# Patient Record
Sex: Female | Born: 1985 | Race: White | Hispanic: No | Marital: Married | Smoking: Never smoker
Health system: Southern US, Community
[De-identification: ages and names within clinical notes are randomized; demographics above are authoritative.]

## PROBLEM LIST (undated history)

## (undated) DIAGNOSIS — F319 Bipolar disorder, unspecified: Secondary | ICD-10-CM

---

## 2013-07-24 DIAGNOSIS — G8929 Other chronic pain: Secondary | ICD-10-CM | POA: Insufficient documentation

## 2013-07-24 DIAGNOSIS — M545 Low back pain, unspecified: Secondary | ICD-10-CM | POA: Insufficient documentation

## 2013-09-03 DIAGNOSIS — F1120 Opioid dependence, uncomplicated: Secondary | ICD-10-CM | POA: Insufficient documentation

## 2014-08-03 ENCOUNTER — Other Ambulatory Visit: Payer: Self-pay | Admitting: Family Medicine

## 2014-08-03 ENCOUNTER — Ambulatory Visit
Admission: RE | Admit: 2014-08-03 | Discharge: 2014-08-03 | Disposition: A | Discharge status: Home / Self Care | Payer: Worker's Compensation | Source: Ambulatory Visit | Attending: Family Medicine | Admitting: Family Medicine

## 2014-08-03 DIAGNOSIS — T1490XA Injury, unspecified, initial encounter: Secondary | ICD-10-CM

## 2015-06-23 ENCOUNTER — Other Ambulatory Visit: Payer: Self-pay | Admitting: Occupational Medicine

## 2015-06-23 ENCOUNTER — Ambulatory Visit: Payer: Self-pay

## 2015-06-23 DIAGNOSIS — M79641 Pain in right hand: Secondary | ICD-10-CM

## 2017-03-06 ENCOUNTER — Ambulatory Visit (INDEPENDENT_AMBULATORY_CARE_PROVIDER_SITE_OTHER): Payer: Medicaid Other | Admitting: Family Medicine

## 2017-03-06 VITALS — BP 136/83 | HR 88 | Wt 155.0 lb

## 2017-03-06 DIAGNOSIS — F1120 Opioid dependence, uncomplicated: Secondary | ICD-10-CM | POA: Diagnosis not present

## 2017-03-06 DIAGNOSIS — M545 Low back pain, unspecified: Secondary | ICD-10-CM

## 2017-03-06 DIAGNOSIS — G8929 Other chronic pain: Secondary | ICD-10-CM

## 2017-03-06 MED ORDER — CLONIDINE HCL 0.1 MG PO TABS: 0.1000 mg | ORAL_TABLET | Freq: Two times a day (BID) | ORAL | 0 refills | Status: DC
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
## 2017-03-06 NOTE — Patient Instructions (Addendum)
Try calling [facility] [phone]  They can prescribe Suboxone for Opiate Addiction.   Take clonidine 0.1mg  twice daily for opiate withdrawal symptoms as needed.    Attend pt for back pain.   Recheck with me in 1 month.   Return sooner if needed.    Buprenorphine; Naloxone oral dissolving film What is this medicine? BUPRENORPHINE; NALOXONE (byoo pre NOR feen; nal OX one) is used to treat certain types of drug dependence. This medicine may be used for other purposes; ask your health care provider or pharmacist if you have questions. COMMON BRAND NAME(S): BUNAVAIL, Suboxone What should I tell my health care provider before I take this medicine? They need to know if you have any of these conditions: -brain tumor -drink more than 3 alcohol-containing drinks per day -head injury -heart disease -kidney disease -liver disease -lung or breathing disease, like asthma -an unusual or allergic reaction to buprenorphine, naloxone, other medicines, foods, dyes, or preservatives -pregnant or trying to get pregnant -breast-feeding How should I use this medicine? For sublingual use (Suboxone): Drink water to moisten the mouth. Then, place the medicine under the tongue and let it dissolve. Follow the directions on the prescription label. Leave this medicine in the sealed foil pack until you are ready to use it. If your dose requires you to take more than 1 film, place the second film under the tongue on other side of the mouth. If your dose requires you to take more than 2 films, place the third film under your tongue on either side after the first 2 films have dissolved. Do not let the films touch in your mouth. After you put this medicine in your mouth, do not move it. Do not swallow, cut, or chew the film. Take your medicine at regular intervals. Do not take your medicine more often than directed. For buccal use (Suboxone or Bunavail): Drink water to moisten the inside of  the cheek. Then, place the medicine against the inside of the moistened cheek and let it dissolve. Follow the directions on the prescription label. Leave this medicine in the sealed foil pack until you are ready to use it. If your dose requires you to take more than 1 film, place the second film on the inside of the other cheek. If your dose requires you to take more than 2 films, place the third film on the inside of your right or left cheek after the first 2 films have dissolved. After you put this medicine in your mouth, do not move it. Do not swallow, cut, or chew the film. Take your medicine at regular intervals. Do not take your medicine more often than directed. A special MedGuide will be given to you by the pharmacist with each prescription and refill. Be sure to read this information carefully each time. Talk to your pediatrician regarding the use of this medicine in children. Special care may be needed. Overdosage: If you think you have taken too much of this medicine contact a poison control center or emergency room at once. NOTE: This medicine is only for you. Do not share this medicine with others. What if I miss a dose? If you miss a dose, take it as soon as you can. If it is almost time for your next dose, take only that dose. Do not take double or extra doses. What may interact with this medicine? Do not take this medication with any of the following medicines: -cisapride -certain medicines for fungal infections like  ketoconazole and itraconazole -dofetilide -dronedarone -pimozide -ritonavir -thioridazine -ziprasidone This medicine may interact with the following medications: -alcohol -antihistamines for allergy, cough and cold -antiviral medicines for HIV or AIDS -atropine -certain antibiotics like clarithromycin, erythromycin, linezold, rifampin -certain medicines for anxiety or sleep -certain medicines for bladder problems like oxybutynin, tolterodine -certain medicines  for depression like amitriptyline, fluoxetine, sertraline -certain medicines for migraine headache like almotriptan, eletriptan, frovatriptan, naratriptan, rizatriptan, sumatriptan, zolmitriptan -certain medicines for nausea or vomiting like dolasetron, ondansetron, palonosetron -certain medicines for Parkinson's disease like benztropine, trihexyphenidyl -certain medicines for seizures like phenobarbital, primidone -certain medicines for stomach problems like cimetidine, dicyclomine, hyoscyamine -certain medicines for travel sickness like scopolamine -diuretics -general anesthetics like halothane, isoflurane, methoxyflurane, propofol -ipratropium -local anesthetics like lidocaine, pramoxine, tetracaine -MAOIs like Carbex, Eldepryl, Marplan, Nardil, and Parnate -medicines that relax muscles for surgery -methylene blue -other medicines that prolong the QT interval (cause an abnormal heart rhythm) -other narcotic medicines for pain or cough -phenothiazines like chlorpromazine, mesoridazine, prochlorperazine, thioridazine This list may not describe all possible interactions. Give your health care provider a list of all the medicines, herbs, non-prescription drugs, or dietary supplements you use. Also tell them if you smoke, drink alcohol, or use illegal drugs. Some items may interact with your medicine. What should I watch for while using this medicine? Tell your doctor or healthcare professional if your symptoms do not start to get better or if they get worse. Do not stop taking except on your doctor's advice. You may develop a severe reaction. Your doctor will tell you how much medicine to take. If you are also taking a narcotic medicine for pain or cough or another medicine that also causes drowsiness, you may have more side effects. Give your health care provider a list of all medicines you use. Your doctor will tell you how much medicine to take. Do not take more medicine than directed. Call  emergency for help if you have problems breathing or unusual sleepiness. You may get drowsy or dizzy. Do not drive, use machinery, or do anything that needs mental alertness until you know how this medicine affects you. Do not stand or sit up quickly, especially if you are an older patient. This reduces the risk of dizzy or fainting spells. Alcohol may interfere with the effect of this medicine. Avoid alcoholic drinks. Wear a medical ID bracelet or chain, and carry a card that describes your disease and details of your medicine and dosage regimens. This medicine may cause constipation. Try to have a bowel movement at least every 2 to 3 days. If you do not have a bowel movement for 3 days, call your doctor or health care professional. Your mouth may get dry. Chewing sugarless gum or sucking hard candy, and drinking plenty of water may help. Contact your doctor if the problem does not go away or is severe. What side effects may I notice from receiving this medicine? Side effects that you should report to your doctor or health care professional as soon as possible: -allergic reactions like skin rash, itching or hives, swelling of the face, lips, or tongue -breathing problems -confusion -signs and symptoms of a dangerous change in heartbeat or heart rhythm like chest pain; dizziness; fast or irregular heartbeat; palpitations; feeling faint or lightheaded, falls; breathing problems -signs and symptoms of liver injury like dark yellow or brown urine; general ill feeling or flu-like symptoms; light-colored stools; loss of appetite; nausea; right upper belly pain; unusually weak or tired; yellow of the eyes or  skin -signs and symptoms of low blood pressure like dizziness; feeling faint or lightheaded, falls; unusually weak or tired -trouble passing urine or change in the amount of urine Side effects that usually do not require medical attention (report to your doctor or health care professional if they  continue or are bothersome): -constipation -dry mouth -nausea, vomiting -tiredness This list may not describe all possible side effects. Call your doctor for medical advice about side effects. You may report side effects to FDA at 1-800-FDA-1088. Where should I keep my medicine? Keep out of the reach of children. This medicine can be abused. Keep your medicine in a safe place to protect it from theft. Do not share this medicine with anyone. Selling or giving away this medicine is dangerous and against the law. Store at room temperature between 15 and 30 degrees C (56 and 86 degrees F). This medicine may cause accidental overdose and death if it is taken by other adults, children, or pets. Remove any unused films from the foil pack and flush them down the toilet to reduce the chance of harm. Do not use the medicine after the expiration date. NOTE: This sheet is a summary. It may not cover all possible information. If you have questions about this medicine, talk to your doctor, pharmacist, or health care provider.  2018 Elsevier/Gold Standard (2015-07-06 10:55:08)

## 2017-03-13 ENCOUNTER — Ambulatory Visit: Payer: Medicaid Other | Admitting: Physical Therapy

## 2017-04-02 ENCOUNTER — Telehealth: Payer: Self-pay

## 2017-04-02 NOTE — Telephone Encounter (Signed)
I have note received a letter. Please have them fax it to me directly at [phone] attn Dr Denyse Amassorey.  I will sign ASAP

## 2017-04-02 NOTE — Telephone Encounter (Signed)
Pt called stating that you were sent a letter for her to receive transportation from and to [facility]. States that she needs this letter ASAP. I am unsure if you reviewed the letter. Please advise.

## 2017-04-03 NOTE — Telephone Encounter (Signed)
Information discussed with pt. Pt verbalized understanding and will have form refaxed.

## 2017-04-10 NOTE — Telephone Encounter (Signed)
Pt called stating that she needs a letter stating that she has been referred to [facility] faxed to attn Regina Swazil[place]Jordan at [phone] at the transportation services including that pt needs transportion on Tuesdays and wednesdays. Letter faxed.
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
## 2024-02-18 ENCOUNTER — Other Ambulatory Visit: Payer: Self-pay

## 2024-02-18 ENCOUNTER — Ambulatory Visit (INDEPENDENT_AMBULATORY_CARE_PROVIDER_SITE_OTHER): Payer: MEDICAID

## 2024-02-18 ENCOUNTER — Ambulatory Visit
Admission: EM | Admit: 2024-02-18 | Discharge: 2024-02-18 | Disposition: A | Discharge status: Home / Self Care | Payer: MEDICAID | Attending: Family Medicine | Admitting: Family Medicine

## 2024-02-18 DIAGNOSIS — M79644 Pain in right finger(s): Secondary | ICD-10-CM | POA: Diagnosis not present

## 2024-02-18 DIAGNOSIS — M25572 Pain in left ankle and joints of left foot: Secondary | ICD-10-CM | POA: Diagnosis not present

## 2024-02-18 DIAGNOSIS — T07XXXA Unspecified multiple injuries, initial encounter: Secondary | ICD-10-CM | POA: Diagnosis not present

## 2024-02-18 HISTORY — DX: Bipolar disorder, unspecified: F31.9

## 2024-02-18 MED ORDER — MUPIROCIN 2 % EX OINT: 1.0000 | TOPICAL_OINTMENT | Freq: Two times a day (BID) | CUTANEOUS | 1 refills | Status: AC

## 2024-02-18 MED ORDER — BACITRACIN ZINC 500 UNIT/GM EX OINT: TOPICAL_OINTMENT | Freq: Two times a day (BID) | CUTANEOUS | Status: DC
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
## 2024-02-18 NOTE — Discharge Instructions (Addendum)
 Wash areas daily to twice a day and apply antibiotic ointment Wounds will heal faster with less scarring if you keep them covered/moist Watch for signs of infection May take ibuprofen 800 mg 3 times a day with food Work note is given
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
